# Patient Record
Sex: Female | Born: 1960 | Race: Black or African American | Hispanic: No | Smoking: Never smoker
Health system: Southern US, Community
[De-identification: ages and names within clinical notes are randomized; demographics above are authoritative.]

## PROBLEM LIST (undated history)

## (undated) DIAGNOSIS — A599 Trichomoniasis, unspecified: Secondary | ICD-10-CM

## (undated) DIAGNOSIS — B009 Herpesviral infection, unspecified: Secondary | ICD-10-CM

## (undated) DIAGNOSIS — N904 Leukoplakia of vulva: Secondary | ICD-10-CM

## (undated) DIAGNOSIS — K219 Gastro-esophageal reflux disease without esophagitis: Secondary | ICD-10-CM

## (undated) DIAGNOSIS — B9689 Other specified bacterial agents as the cause of diseases classified elsewhere: Secondary | ICD-10-CM

## (undated) DIAGNOSIS — N62 Hypertrophy of breast: Secondary | ICD-10-CM

## (undated) DIAGNOSIS — N760 Acute vaginitis: Secondary | ICD-10-CM

## (undated) DIAGNOSIS — D219 Benign neoplasm of connective and other soft tissue, unspecified: Secondary | ICD-10-CM

## (undated) DIAGNOSIS — D649 Anemia, unspecified: Secondary | ICD-10-CM

## (undated) DIAGNOSIS — I10 Essential (primary) hypertension: Secondary | ICD-10-CM

## (undated) DIAGNOSIS — D071 Carcinoma in situ of vulva: Secondary | ICD-10-CM

## (undated) HISTORY — DX: Trichomoniasis, unspecified: A59.9

## (undated) HISTORY — DX: Other specified bacterial agents as the cause of diseases classified elsewhere: N76.0

## (undated) HISTORY — PX: DILATION AND CURETTAGE OF UTERUS: SHX78

## (undated) HISTORY — PX: HYSTEROSCOPY: SHX211

## (undated) HISTORY — DX: Herpesviral infection, unspecified: B00.9

## (undated) HISTORY — DX: Other specified bacterial agents as the cause of diseases classified elsewhere: B96.89

## (undated) HISTORY — DX: Hypertrophy of breast: N62

## (undated) HISTORY — DX: Gastro-esophageal reflux disease without esophagitis: K21.9

## (undated) HISTORY — DX: Carcinoma in situ of vulva: D07.1

## (undated) HISTORY — DX: Benign neoplasm of connective and other soft tissue, unspecified: D21.9

## (undated) HISTORY — DX: Leukoplakia of vulva: N90.4

## (undated) HISTORY — DX: Anemia, unspecified: D64.9

## (undated) HISTORY — DX: Essential (primary) hypertension: I10

---

## 1998-12-22 ENCOUNTER — Other Ambulatory Visit: Admission: RE | Admit: 1998-12-22 | Discharge: 1998-12-22 | Payer: Self-pay | Admitting: Obstetrics and Gynecology

## 1999-12-25 ENCOUNTER — Other Ambulatory Visit: Admission: RE | Admit: 1999-12-25 | Discharge: 1999-12-25 | Payer: Self-pay | Admitting: Obstetrics and Gynecology

## 1999-12-28 ENCOUNTER — Ambulatory Visit (HOSPITAL_COMMUNITY): Admission: RE | Admit: 1999-12-28 | Discharge: 1999-12-28 | Payer: Self-pay | Admitting: Obstetrics and Gynecology

## 1999-12-28 ENCOUNTER — Encounter: Payer: Self-pay | Admitting: Obstetrics and Gynecology

## 2001-02-04 ENCOUNTER — Other Ambulatory Visit: Admission: RE | Admit: 2001-02-04 | Discharge: 2001-02-04 | Payer: Self-pay | Admitting: Obstetrics and Gynecology

## 2002-02-17 ENCOUNTER — Other Ambulatory Visit: Admission: RE | Admit: 2002-02-17 | Discharge: 2002-02-17 | Payer: Self-pay | Admitting: Obstetrics and Gynecology

## 2003-02-22 ENCOUNTER — Other Ambulatory Visit: Admission: RE | Admit: 2003-02-22 | Discharge: 2003-02-22 | Payer: Self-pay | Admitting: Obstetrics and Gynecology

## 2003-03-02 ENCOUNTER — Encounter: Payer: Self-pay | Admitting: Obstetrics and Gynecology

## 2003-03-02 ENCOUNTER — Ambulatory Visit (HOSPITAL_COMMUNITY): Admission: RE | Admit: 2003-03-02 | Discharge: 2003-03-02 | Payer: Self-pay | Admitting: Obstetrics and Gynecology

## 2003-03-17 ENCOUNTER — Ambulatory Visit (HOSPITAL_COMMUNITY): Admission: RE | Admit: 2003-03-17 | Discharge: 2003-03-17 | Payer: Self-pay | Admitting: Obstetrics and Gynecology

## 2003-03-17 ENCOUNTER — Encounter: Payer: Self-pay | Admitting: Obstetrics and Gynecology

## 2003-08-25 ENCOUNTER — Ambulatory Visit (HOSPITAL_COMMUNITY): Admission: RE | Admit: 2003-08-25 | Discharge: 2003-08-25 | Payer: Self-pay | Admitting: Obstetrics and Gynecology

## 2003-08-25 ENCOUNTER — Encounter (INDEPENDENT_AMBULATORY_CARE_PROVIDER_SITE_OTHER): Payer: Self-pay | Admitting: *Deleted

## 2004-02-29 ENCOUNTER — Other Ambulatory Visit: Admission: RE | Admit: 2004-02-29 | Discharge: 2004-02-29 | Payer: Self-pay | Admitting: Obstetrics and Gynecology

## 2004-03-15 ENCOUNTER — Ambulatory Visit (HOSPITAL_COMMUNITY): Admission: RE | Admit: 2004-03-15 | Discharge: 2004-03-15 | Payer: Self-pay | Admitting: Obstetrics and Gynecology

## 2005-05-20 ENCOUNTER — Other Ambulatory Visit: Admission: RE | Admit: 2005-05-20 | Discharge: 2005-05-20 | Payer: Self-pay | Admitting: Obstetrics and Gynecology

## 2005-08-29 ENCOUNTER — Ambulatory Visit (HOSPITAL_COMMUNITY): Admission: RE | Admit: 2005-08-29 | Discharge: 2005-08-29 | Payer: Self-pay | Admitting: Obstetrics and Gynecology

## 2006-06-10 ENCOUNTER — Other Ambulatory Visit: Admission: RE | Admit: 2006-06-10 | Discharge: 2006-06-10 | Payer: Self-pay | Admitting: Obstetrics and Gynecology

## 2006-09-01 ENCOUNTER — Ambulatory Visit (HOSPITAL_COMMUNITY): Admission: RE | Admit: 2006-09-01 | Discharge: 2006-09-01 | Payer: Self-pay | Admitting: Obstetrics and Gynecology

## 2007-09-16 ENCOUNTER — Ambulatory Visit (HOSPITAL_COMMUNITY): Admission: RE | Admit: 2007-09-16 | Discharge: 2007-09-16 | Payer: Self-pay | Admitting: Obstetrics and Gynecology

## 2007-09-23 ENCOUNTER — Encounter: Admission: RE | Admit: 2007-09-23 | Discharge: 2007-09-23 | Payer: Self-pay | Admitting: Obstetrics and Gynecology

## 2008-09-23 ENCOUNTER — Ambulatory Visit (HOSPITAL_COMMUNITY): Admission: RE | Admit: 2008-09-23 | Discharge: 2008-09-23 | Payer: Self-pay | Admitting: Obstetrics and Gynecology

## 2009-09-26 ENCOUNTER — Ambulatory Visit (HOSPITAL_COMMUNITY): Admission: RE | Admit: 2009-09-26 | Discharge: 2009-09-26 | Payer: Self-pay | Admitting: Obstetrics and Gynecology

## 2010-11-01 ENCOUNTER — Ambulatory Visit (HOSPITAL_COMMUNITY): Admission: RE | Admit: 2010-11-01 | Discharge: 2010-11-01 | Payer: Self-pay | Admitting: Obstetrics and Gynecology

## 2010-12-23 ENCOUNTER — Encounter: Payer: Self-pay | Admitting: Obstetrics and Gynecology

## 2011-04-19 NOTE — H&P (Signed)
NAME:  Erika Ortiz, Erika Ortiz                          ACCOUNT NO.:  [id number]   MEDICAL RECORD NO.:  [id number]                   PATIENT TYPE:  AMB   LOCATION:  SDC                                  FACILITY:  [facility]   PHYSICIAN:  Erika Ortiz, M.D.             DATE OF BIRTH:  1961-02-18   DATE OF ADMISSION:  DATE OF DISCHARGE:                                HISTORY & PHYSICAL   HISTORY OF PRESENT ILLNESS:  Erika Ortiz is a 50 year old divorced African-  American female para 1-0-3-1 who presents for hysteroscopy because of  intermenstrual bleeding.  For the past four months the patient has bled  approximately three times per month intermittently in addition to her normal  menstrual flow.  This intermenstrual bleeding lasts approximately two days  with episode, causing her to change a tampon twice daily but she reports no  cramping.  With use of oral contraceptives, the frequency of the patient's  intermenstrual bleeding has decreased; however, has not totally resolved.  A  TSH in March 2004 was within normal limits, hemoglobin 11.2, and a  sonohysterogram in April 2004 revealed a uterus measuring 10.4 cm x 4.1 cm,  normal-appearing ovaries, and no evidence of lesions within the endometrial  canal.  The patient denies any nausea, vomiting, diarrhea, urinary tract  symptoms, dyspareunia, or cramping associated with her symptoms.  Options  for management and evaluation were discussed with the patient to include  observation, hysteroscopy, and endometrial ablation.  Since the patient  desires to preserve her fertility she has chosen hysteroscopy for further  evaluation of her abnormal bleeding.   PAST MEDICAL HISTORY:  1. OB history:  Gravida 4 para 1-0-3-1.  The patient has had cesarean     section.  2. GYN history:  Menarche 50 years old.  The patient's menstrual flow has     been regular until as mentioned in history of present illness.  She uses     oral contraceptives as a method of  contraception.  She has a remote     history of sexually-transmitted diseases.  Denies any history of abnormal     Pap smears.  Her last normal Pap smear and mammogram was in March 2004.  3. Medical history:  GERD, anemia, and herpes simplex virus 2.  4. Surgical history:  Cesarean section.  The patient denies any problems     with anesthesia or history of blood transfusions.   FAMILY HISTORY:  Positive for lung cancer and heart disease.   SOCIAL HISTORY:  The patient is single and she works as an Geologist, engineering.   HABITS:  The patient does not use tobacco and she drinks alcohol on  occasion.   CURRENT MEDICATIONS:  Ortho Tri-Cyclen Lo.   ALLERGIES:  No known drug allergies.   REVIEW OF SYSTEMS:  Positive for occasional lower back pain; otherwise,  negative except as mentioned in history of present  illness.   PHYSICAL EXAMINATION:  VITAL SIGNS:  Blood pressure is 105/80, weight is  182, height is 5 feet 1 inch tall.  NECK:  Supple without masses.  There is no adenopathy or thyromegaly.  HEART:  Regular rate and rhythm.  There is no murmur.  LUNGS:  Clear to auscultation.  There are no wheezes, rales, or rhonchi.  BACK:  No CVA tenderness.  ABDOMEN:  Bowel sounds are present.  It is soft.  It is diffusely tender  without guarding, rebound, or organomegaly.  EXTREMITIES:  Without clubbing, cyanosis, or edema.  PELVIC:  EG/BUS is within normal limits.  Vagina is rugous.  Cervix is  nontender without lesions.  Uterus appears 10-12 weeks size, tender, mobile.  Adnexa without tenderness or masses.  Rectovaginal exam is without  tenderness or masses.   IMPRESSION:  Intermenstrual bleeding.   DISPOSITION:  The patient has chosen hysteroscopy with the possibility of  resection of any lesion found within the endometrium as a means of  evaluation and/or management of her symptoms.  She understands the  implications for this procedure along with its risks which include but are  not limited  to:  Reaction to anesthesia, damage to adjacent organs,  excessive bleeding, and infection.  The patient is scheduled to undergo  hysteroscopy with possible D&C on August 25, 2003 at [facility] at 9 a.m.     [place] J. Adline Peals.                    Erika Ortiz, M.D.    EJP/MEDQ  D:  08/19/2003  T:  08/19/2003  Job:  [id number]

## 2011-04-19 NOTE — Op Note (Signed)
   NAME:  Erika Ortiz, Erika Ortiz                          ACCOUNT NO.:  [id number]   MEDICAL RECORD NO.:  [id number]                   PATIENT TYPE:  AMB   LOCATION:  SDC                                  [facility]ACILITY:  [facility]   PHYSICIAN:  Hal Morales, M.D.             DATE OF BIRTH:  Sep 08, 1961   DATE OF PROCEDURE:  08/25/2003  DATE OF DISCHARGE:                                 OPERATIVE REPORT   PREOPERATIVE DIAGNOSIS:  Intermenstrual bleeding.   POSTOPERATIVE DIAGNOSIS:  Intermenstrual bleeding.   OPERATION:  Diagnostic hysteroscopy, diagnostic dilatation and curettage.   SURGEON:  Hal Morales, M.D.   ANESTHESIA:  General LMA.   ESTIMATED BLOOD LOSS:  Less than 10 mL.   COMPLICATIONS:  None.   FINDINGS:  The uterus was upper limits of normal size and sounded to 9 cm.  At the time of hysteroscopy no intrauterine endometrial lesions could be  noted.  A small amount of tissue was obtained at the time of curettage.  The  fluid deficit was 110 mL.   DESCRIPTION OF PROCEDURE:  The patient was taken to the operating room after  appropriate identification and placed on the operating table.  After the  attainment of adequate general anesthesia, she was placed in the modified  lithotomy position.  The perineum and vagina were prepped with multiple  layers of Betadine and draped into a sterile field.  A red Robinson catheter  was used to empty the bladder.  A Graves speculum was placed in the vagina  and a paracervical block achieved with a total of 10 mL of 2% Xylocaine in  the 5 and 7 o'clock positions.  The uterus was sounded to 9 cm.  The cervix  was dilated to accommodate the diagnostic hysteroscope and the hysteroscope  was used to investigate all quadrants of the endometrial cavity with the  findings of no lesions.  The hysteroscope was removed and a medium curette  used to curette all four quadrants of the uterus with a small amount of  curettings obtained.  The hysteroscope was  again used to further document no  noted intrauterine lesions.  All instruments were then removed from the  operative site and the patient awakened from general anesthesia and taken to  the recovery room in satisfactory condition, having tolerated the procedure  well with sponge and instrument counts correct.   SPECIMENS TO PATHOLOGY:  Endometrial curettings.                                                Hal Morales, M.D.    VPH/MEDQ  D:  08/25/2003  T:  08/26/2003  Job:  [id number]

## 2011-11-13 ENCOUNTER — Other Ambulatory Visit (HOSPITAL_COMMUNITY): Payer: Self-pay | Admitting: Obstetrics and Gynecology

## 2011-11-13 DIAGNOSIS — Z1231 Encounter for screening mammogram for malignant neoplasm of breast: Secondary | ICD-10-CM

## 2011-12-18 ENCOUNTER — Ambulatory Visit (HOSPITAL_COMMUNITY)
Admission: RE | Admit: 2011-12-18 | Discharge: 2011-12-18 | Disposition: A | Discharge status: Home / Self Care | Payer: 59 | Source: Ambulatory Visit | Attending: Obstetrics and Gynecology | Admitting: Obstetrics and Gynecology

## 2011-12-18 DIAGNOSIS — Z1231 Encounter for screening mammogram for malignant neoplasm of breast: Secondary | ICD-10-CM | POA: Insufficient documentation

## 2012-09-01 ENCOUNTER — Ambulatory Visit (INDEPENDENT_AMBULATORY_CARE_PROVIDER_SITE_OTHER): Payer: 59 | Admitting: Obstetrics and Gynecology

## 2012-09-01 ENCOUNTER — Encounter: Payer: Self-pay | Admitting: Obstetrics and Gynecology

## 2012-09-01 VITALS — BP 112/66 | Ht 61.0 in | Wt 221.0 lb

## 2012-09-01 DIAGNOSIS — I10 Essential (primary) hypertension: Secondary | ICD-10-CM

## 2012-09-01 DIAGNOSIS — R350 Frequency of micturition: Secondary | ICD-10-CM

## 2012-09-01 DIAGNOSIS — B9689 Other specified bacterial agents as the cause of diseases classified elsewhere: Secondary | ICD-10-CM

## 2012-09-01 DIAGNOSIS — Z124 Encounter for screening for malignant neoplasm of cervix: Secondary | ICD-10-CM

## 2012-09-01 DIAGNOSIS — N898 Other specified noninflammatory disorders of vagina: Secondary | ICD-10-CM

## 2012-09-01 DIAGNOSIS — N760 Acute vaginitis: Secondary | ICD-10-CM

## 2012-09-01 DIAGNOSIS — Z309 Encounter for contraceptive management, unspecified: Secondary | ICD-10-CM

## 2012-09-01 DIAGNOSIS — N920 Excessive and frequent menstruation with regular cycle: Secondary | ICD-10-CM

## 2012-09-01 DIAGNOSIS — A499 Bacterial infection, unspecified: Secondary | ICD-10-CM

## 2012-09-01 LAB — POCT WET PREP (WET MOUNT)
Clue Cells Wet Prep Whiff POC: NEGATIVE
KOH Wet Prep POC: NEGATIVE
Trichomonas Wet Prep HPF POC: NEGATIVE
WBC, Wet Prep HPF POC: NEGATIVE

## 2012-09-01 LAB — POCT URINALYSIS DIPSTICK
Bilirubin, UA: NEGATIVE
Nitrite, UA: NEGATIVE
Urobilinogen, UA: NEGATIVE

## 2012-09-01 MED ORDER — METRONIDAZOLE 500 MG PO TABS: 500.0000 mg | ORAL_TABLET | Freq: Two times a day (BID) | ORAL | Status: AC

## 2012-09-01 MED ORDER — MISOPROSTOL 200 MCG PO TABS: ORAL_TABLET | ORAL | Status: DC

## 2012-09-01 MED ORDER — METRONIDAZOLE 500 MG PO TABS: 500.0000 mg | ORAL_TABLET | Freq: Three times a day (TID) | ORAL | Status: DC

## 2012-09-01 MED ORDER — NORETHINDRONE 0.35 MG PO TABS: 1.0000 | ORAL_TABLET | Freq: Every day | ORAL | Status: AC

## 2012-09-01 NOTE — Progress Notes (Signed)
AEX  Last Pap: 08/30/2009 WNL: No Regular Periods:yes Contraception: Nortrel 7,7,7  Monthly Breast exam:yes Tetanus<81yrs:yes Nl.Bladder Function:no increased urination within last week Daily BMs:yes Healthy Diet:no Calcium:no Mammogram:yes Date of Mammogram: 12/18/2011 Exercise:no Have often Exercise: n/a Seatbelt: yes Abuse at home: no Stressful work:no Sigmoid-colonoscopy: n/a Bone Density: No PCP: Johnn Hai Change in PMH: None Change in [facility]: None   Subjective:    Erika Ortiz is a 51 y.o. female, G4P1, who presents for an annual exam. She c/o brownish d/c without irritation and a slight odor.  Not sexually active.  BCPs regulate and minimize menses    History   Social History  . Marital Status: Divorced    Spouse Name: N/A    Number of Children: N/A  . Years of Education: N/A   Social History Main Topics  . Smoking status: Never Smoker   . Smokeless tobacco: Never Used  . Alcohol Use: No  . Drug Use: No  . Sexually Active: Yes    Birth Control/ Protection: Pill     NORTREL   Other Topics Concern  . None   Social History Narrative  . None    Menstrual cycle:   LMP: Patient's last menstrual period was 08/19/2012.           Cycle: reg with BCPs.  No IM bleeding.  No cramps.  The following portions of the patient's history were reviewed and updated as appropriate: allergies, current medications, past family history, past medical history, past social history, past surgical history and problem list.  Review of Systems Pertinent items are noted in HPI. Breast:Negative for breast lump,nipple discharge or nipple retraction Gastrointestinal: Negative for abdominal pain, change in bowel habits or rectal bleeding Urinary:negative   Objective:    BP 112/66  Ht 5\' 1"  (1.549 m)  Wt 221 lb (100.245 kg)  BMI 41.76 kg/m2  LMP 08/19/2012    Weight:  Wt Readings from Last 1 Encounters:  09/01/12 221 lb (100.245 kg)          BMI: Body mass index is  41.76 kg/(m^2).  General Appearance: Alert, appropriate appearance for age. No acute distress HEENT: Grossly normal Neck / Thyroid: Supple, no masses, nodes or enlargement Lungs: clear to auscultation bilaterally Back: No CVA tenderness Breast Exam: No masses or nodes.No dimpling, nipple retraction or discharge. Cardiovascular: Regular rate and rhythm. S1, S2, no murmur Gastrointestinal: Soft, non-tender, no masses or organomegaly Pelvic Exam: Vulva and vagina appear normal. Bimanual exam reveals normal uterus and adnexa. Exam limited by body habitus Rectovaginal: normal rectal, no masses Lymphatic Exam: Non-palpable nodes in neck, clavicular, axillary, or inguinal regions Skin: no rash or abnormalities Neurologic: Normal gait and speech, no tremor  Psychiatric: Alert and oriented, appropriate affect.   Wet Prep:positive clue cells, pH 5.0, OSOM BV:  POS OSOM TRICH:  NEG     Assessment:    BV Menses well regulated on BCPs Chronic hypertnsion   Plan:   Metronidazole  OTC Luvenia after menses mammogram pap smear return 3 months STD screening: declined Contraception:oral contraceptives (estrogen/progesterone)peviously.  Oral progesterone-only contraceptive recommended and accepted because of hypertension      Dierdre Forth MD

## 2012-09-01 NOTE — Patient Instructions (Signed)
OTC Luvenia to be used after menstrual period     Bacterial Vaginosis Bacterial vaginosis (BV) is a vaginal infection where the normal balance of bacteria in the vagina is disrupted. The normal balance is then replaced by an overgrowth of certain bacteria. There are several different kinds of bacteria that can cause BV. BV is the most common vaginal infection in women of childbearing age. CAUSES   The cause of BV is not fully understood. BV develops when there is an increase or imbalance of harmful bacteria.  Some activities or behaviors can upset the normal balance of bacteria in the vagina and put women at increased risk including:  Having a new sex partner or multiple sex partners.  Douching.  Using an intrauterine device (IUD) for contraception.  It is not clear what role sexual activity plays in the development of BV. However, women that have never had sexual intercourse are rarely infected with BV. Women do not get BV from toilet seats, bedding, swimming pools or from touching objects around them.  SYMPTOMS   Grey vaginal discharge.  A fish-like odor with discharge, especially after sexual intercourse.  Itching or burning of the vagina and vulva.  Burning or pain with urination.  Some women have no signs or symptoms at all. DIAGNOSIS  Your caregiver must examine the vagina for signs of BV. Your caregiver will perform lab tests and look at the sample of vaginal fluid through a microscope. They will look for bacteria and abnormal cells (clue cells), a pH test higher than 4.5, and a positive amine test all associated with BV.  RISKS AND COMPLICATIONS   Pelvic inflammatory disease (PID).  Infections following gynecology surgery.  Developing HIV.  Developing herpes virus. TREATMENT  Sometimes BV will clear up without treatment. However, all women with symptoms of BV should be treated to avoid complications, especially if gynecology surgery is planned. Female partners  generally do not need to be treated. However, BV may spread between female sex partners so treatment is helpful in preventing a recurrence of BV.   BV may be treated with antibiotics. The antibiotics come in either pill or vaginal cream forms. Either can be used with nonpregnant or pregnant women, but the recommended dosages differ. These antibiotics are not harmful to the baby.  BV can recur after treatment. If this happens, a second round of antibiotics will often be prescribed.  Treatment is important for pregnant women. If not treated, BV can cause a premature delivery, especially for a pregnant woman who had a premature birth in the past. All pregnant women who have symptoms of BV should be checked and treated.  For chronic reoccurrence of BV, treatment with a type of prescribed gel vaginally twice a week is helpful. HOME CARE INSTRUCTIONS   Finish all medication as directed by your caregiver.  Do not have sex until treatment is completed.  Tell your sexual partner that you have a vaginal infection. They should see their caregiver and be treated if they have problems, such as a mild rash or itching.  Practice safe sex. Use condoms. Only have 1 sex partner. PREVENTION  Basic prevention steps can help reduce the risk of upsetting the natural balance of bacteria in the vagina and developing BV:  Do not have sexual intercourse (be abstinent).  Do not douche.  Use all of the medicine prescribed for treatment of BV, even if the signs and symptoms go away.  Tell your sex partner if you have BV. That way, they can  be treated, if needed, to prevent reoccurrence. SEEK MEDICAL CARE IF:   Your symptoms are not improving after 3 days of treatment.  You have increased discharge, pain, or fever. MAKE SURE YOU:   Understand these instructions.  Will watch your condition.  Will get help right away if you are not doing well or get worse. FOR MORE INFORMATION  Division of STD Prevention  (DSTDP), Centers for Disease Control and Prevention: [employer] American Social Health Association (ASHA): www.ashastd.org  Document Released: 11/18/2005 Document Revised: 02/10/2012 Document Reviewed: 05/11/2009 [facility] Patient Information 2013 [place], [place].

## 2012-09-03 ENCOUNTER — Telehealth: Payer: Self-pay | Admitting: Obstetrics and Gynecology

## 2012-09-03 NOTE — Telephone Encounter (Signed)
VM from pt.  States saw DR [facility] 09/01/12. Was to be given RX for Metronidazole # 14.  Pt received different med and quantity. Wants to verify before taking. PT [phone]

## 2012-09-03 NOTE — Telephone Encounter (Signed)
Tc to pt per telephone call. Pt states,"was given Metronidazole 500mg  TID #17 pills by pharm". Rx for Metronidazole 500mg  BID #14 was e-pres to pharm on 09/01/12 by VPH. Pt has not taken any pills at this time. Will consult with pharm and cb with recs. Pt agrees. Pc to pharm on file. Pharm aware of error. Pt to bring rx back to have correct dosage/dispense of med. Pt agrees.

## 2012-09-14 ENCOUNTER — Other Ambulatory Visit: Payer: Self-pay | Admitting: Obstetrics and Gynecology

## 2012-09-15 ENCOUNTER — Telehealth: Payer: Self-pay | Admitting: Obstetrics and Gynecology

## 2012-09-16 ENCOUNTER — Encounter: Payer: 59 | Admitting: Obstetrics and Gynecology

## 2012-11-20 ENCOUNTER — Other Ambulatory Visit: Payer: Self-pay | Admitting: Obstetrics and Gynecology

## 2012-11-20 DIAGNOSIS — Z1231 Encounter for screening mammogram for malignant neoplasm of breast: Secondary | ICD-10-CM

## 2012-12-25 ENCOUNTER — Ambulatory Visit (HOSPITAL_COMMUNITY)
Admission: RE | Admit: 2012-12-25 | Discharge: 2012-12-25 | Disposition: A | Discharge status: Home / Self Care | Payer: 59 | Source: Ambulatory Visit | Attending: Obstetrics and Gynecology | Admitting: Obstetrics and Gynecology

## 2012-12-25 DIAGNOSIS — Z1231 Encounter for screening mammogram for malignant neoplasm of breast: Secondary | ICD-10-CM

## 2012-12-28 ENCOUNTER — Encounter: Payer: Self-pay | Admitting: Obstetrics and Gynecology

## 2012-12-28 DIAGNOSIS — R922 Inconclusive mammogram: Secondary | ICD-10-CM | POA: Insufficient documentation

## 2013-10-07 ENCOUNTER — Other Ambulatory Visit: Payer: Self-pay

## 2014-09-16 ENCOUNTER — Other Ambulatory Visit: Payer: Self-pay

## 2014-10-03 ENCOUNTER — Encounter: Payer: Self-pay | Admitting: Obstetrics and Gynecology

## 2016-01-22 ENCOUNTER — Other Ambulatory Visit: Payer: Self-pay | Admitting: Obstetrics and Gynecology

## 2017-05-25 ENCOUNTER — Encounter (HOSPITAL_COMMUNITY): Payer: Self-pay

## 2017-05-25 ENCOUNTER — Emergency Department (HOSPITAL_COMMUNITY)
Admission: EM | Admit: 2017-05-25 | Discharge: 2017-05-25 | Disposition: A | Discharge status: Home / Self Care | Payer: Self-pay | Attending: Emergency Medicine | Admitting: Emergency Medicine

## 2017-05-25 DIAGNOSIS — Z885 Allergy status to narcotic agent status: Secondary | ICD-10-CM | POA: Insufficient documentation

## 2017-05-25 DIAGNOSIS — I10 Essential (primary) hypertension: Secondary | ICD-10-CM | POA: Insufficient documentation

## 2017-05-25 DIAGNOSIS — R51 Headache: Secondary | ICD-10-CM | POA: Insufficient documentation

## 2017-05-25 DIAGNOSIS — Z880 Allergy status to penicillin: Secondary | ICD-10-CM | POA: Insufficient documentation

## 2017-05-25 DIAGNOSIS — R42 Dizziness and giddiness: Secondary | ICD-10-CM | POA: Insufficient documentation

## 2017-05-25 LAB — CBC
HEMATOCRIT: 38.5 % (ref 36.0–46.0)
Hemoglobin: 11.9 g/dL — ABNORMAL LOW (ref 12.0–15.0)
MCH: 25.4 pg — ABNORMAL LOW (ref 26.0–34.0)
MCHC: 30.9 g/dL (ref 30.0–36.0)
MCV: 82.3 fL (ref 78.0–100.0)
PLATELETS: 268 10*3/uL (ref 150–400)
RBC: 4.68 MIL/uL (ref 3.87–5.11)
RDW: 14.1 % (ref 11.5–15.5)
WBC: 7.4 10*3/uL (ref 4.0–10.5)

## 2017-05-25 LAB — BASIC METABOLIC PANEL
ANION GAP: 6 (ref 5–15)
BUN: 9 mg/dL (ref 6–20)
CALCIUM: 9.5 mg/dL (ref 8.9–10.3)
CO2: 25 mmol/L (ref 22–32)
CREATININE: 0.77 mg/dL (ref 0.44–1.00)
Chloride: 107 mmol/L (ref 101–111)
GLUCOSE: 93 mg/dL (ref 65–99)
Potassium: 3.9 mmol/L (ref 3.5–5.1)
Sodium: 138 mmol/L (ref 135–145)

## 2017-05-25 MED ORDER — HYDROCHLOROTHIAZIDE 25 MG PO TABS: 25.0000 mg | ORAL_TABLET | Freq: Every day | ORAL | 0 refills | Status: AC

## 2017-05-25 NOTE — ED Provider Notes (Signed)
[facility] DEPT Provider Note   CSN: [id number] Arrival date & time: 05/25/17  1523     History   Chief Complaint Chief Complaint  Patient presents with  . Hypertension    HPI Erika Ortiz is a 56 y.o. female.  Patient is a 56 year old female with a history of hypertension who presents with elevated blood pressures. She states that she's been off of her blood pressure medicine for a little bit over a year due to financial reasons. She states that she's felt like her blood pressures been higher over the last few days. She's had some intermittent headaches and a little bit of dizziness. She currently denies any symptoms. She denies any headache or dizziness. No chest pain or shortness of breath. No change in her speech. No numbness or weakness to her extremities. She was seen at an equal walk-in clinic and sent over here for further evaluation due to her elevated blood pressure.      Past Medical History:  Diagnosis Date  . Anemia   . BV (bacterial vaginosis)   . Fibroids   . GERD (gastroesophageal reflux disease)   . HSV-2 infection   . Hypertension   . Macromastia   . Trichomonas   . VIN III (vulvar intraepithelial neoplasia III)   . Vulvar dystrophy     Patient Active Problem List   Diagnosis Date Noted  . Dense breasts 12/28/2012  . Hypertension 09/01/2012  . Menorrhagia 09/01/2012    Past Surgical History:  Procedure Laterality Date  . DILATION AND CURETTAGE OF UTERUS    . HYSTEROSCOPY      OB History    Gravida Para Term Preterm AB Living   4 1       1    SAB TAB Ectopic Multiple Live Births                   Home Medications    Prior to Admission medications   Medication Sig Start Date End Date Taking? Authorizing Provider  Cholecalciferol (VITAMIN D PO) Take by mouth.    [provider]  fexofenadine-pseudoephedrine (ALLEGRA-D 24) 180-240 MG per 24 hr tablet Take 1 tablet by mouth daily.    [provider]    hydrochlorothiazide (HYDRODIURIL) 25 MG tablet Take 1 tablet (25 mg total) by mouth daily. 05/25/17   Malvin Johns, MD  norethindrone (MICRONOR,CAMILA,ERRIN) 0.35 MG tablet Take 1 tablet (0.35 mg total) by mouth daily. 09/01/12   Haygood, Seymour Bars, MD    Family History Family History  Problem Relation Age of Onset  . Hypertension Mother   . Hypertension Father   . Cancer Maternal Aunt     Social History Social History  Substance Use Topics  . Smoking status: Never Smoker  . Smokeless tobacco: Never Used  . Alcohol use No     Allergies   Amoxicillin; Hydrocodone; Naproxen; and Penicillins   Review of Systems Review of Systems  Constitutional: Negative for chills, diaphoresis, fatigue and fever.  HENT: Negative for congestion, rhinorrhea and sneezing.   Eyes: Negative.   Respiratory: Negative for cough, chest tightness and shortness of breath.   Cardiovascular: Negative for chest pain and leg swelling.  Gastrointestinal: Negative for abdominal pain, blood in stool, diarrhea, nausea and vomiting.  Genitourinary: Negative for difficulty urinating, flank pain, frequency and hematuria.  Musculoskeletal: Negative for arthralgias and back pain.  Skin: Negative for rash.  Neurological: Positive for light-headedness and headaches. Negative for dizziness, speech difficulty, weakness and numbness.  Physical Exam Updated Vital Signs BP (!) 199/100 (BP Location: Left Arm)   Pulse 65   Temp 98.2 F (36.8 C) (Oral)   Resp 18   LMP 12/11/2012   SpO2 99%   Physical Exam  Constitutional: She is oriented to person, place, and time. She appears well-developed and well-nourished.  HENT:  Head: Normocephalic and atraumatic.  Eyes: Pupils are equal, round, and reactive to light.  Neck: Normal range of motion. Neck supple.  Cardiovascular: Normal rate, regular rhythm and normal heart sounds.   Pulmonary/Chest: Effort normal and breath sounds normal. No respiratory distress. She  has no wheezes. She has no rales. She exhibits no tenderness.  Abdominal: Soft. Bowel sounds are normal. There is no tenderness. There is no rebound and no guarding.  Musculoskeletal: Normal range of motion. She exhibits no edema.  Lymphadenopathy:    She has no cervical adenopathy.  Neurological: She is alert and oriented to person, place, and time.  Motor 5/5 all extremities Sensation grossly intact to LT all extremities Finger to Nose intact, no pronator drift CN II-XII grossly intact Gait normal   Skin: Skin is warm and dry. No rash noted.  Psychiatric: She has a normal mood and affect.     ED Treatments / Results  Labs (all labs ordered are listed, but only abnormal results are displayed) Labs Reviewed  CBC - Abnormal; Notable for the following:       Result Value   Hemoglobin 11.9 (*)    MCH 25.4 (*)    All other components within normal limits  BASIC METABOLIC PANEL    EKG  EKG Interpretation  Date/Time:  Sunday May 25 2017 17:01:27 EDT Ventricular Rate:  55 PR Interval:    QRS Duration: 88 QT Interval:  419 QTC Calculation: 401 R Axis:   62 Text Interpretation:  Sinus rhythm Atrial premature complex No old tracing to compare Confirmed by Malvin Johns [phone]) on 05/25/2017 5:09:39 PM       Radiology No results found.  Procedures Procedures (including critical care time)  Medications Ordered in ED Medications - No data to display   Initial Impression / Assessment and Plan / ED Course  I have reviewed the triage vital signs and the nursing notes.  Pertinent labs & imaging results that were available during my care of the patient were reviewed by me and considered in my medical decision making (see chart for details).     Patient presents with an elevated blood pressure. Currently she is asymptomatic. She has no ataxia. No neurologic deficits. No chest pain or other suggestions of acute coronary syndrome. No evidence of endorgan damage. She was  discharged home in good condition. She was given a prescription for hydrochlorothiazide and advised to start taking it today. She was advised to follow-up with her PCP who is with [facility] physicians within the next week. Return precautions were given.  Final Clinical Impressions(s) / ED Diagnoses   Final diagnoses:  Essential hypertension    New Prescriptions New Prescriptions   HYDROCHLOROTHIAZIDE (HYDRODIURIL) 25 MG TABLET    Take 1 tablet (25 mg total) by mouth daily.     Malvin Johns, MD 05/25/17 [phone]

## 2017-05-25 NOTE — ED Triage Notes (Signed)
Patient was sent from [facility] for further work-up of HTN. States that she has taken meds in the past and due to financial reasons hasn't taken any meds x 2 years. Had headache yesterday but today resolved. No CP, no SOB. Alert and oriented, NAD

## 2018-12-30 ENCOUNTER — Other Ambulatory Visit: Payer: Self-pay | Admitting: Family Medicine

## 2018-12-30 ENCOUNTER — Other Ambulatory Visit (HOSPITAL_COMMUNITY)
Admission: RE | Admit: 2018-12-30 | Discharge: 2018-12-30 | Disposition: A | Discharge status: Home / Self Care | Payer: Self-pay | Source: Ambulatory Visit | Attending: Family Medicine | Admitting: Family Medicine

## 2018-12-30 DIAGNOSIS — Z01411 Encounter for gynecological examination (general) (routine) with abnormal findings: Secondary | ICD-10-CM | POA: Insufficient documentation

## 2019-01-04 LAB — CYTOLOGY - PAP
Adequacy: ABSENT
Diagnosis: NEGATIVE
HPV: NOT DETECTED

## 2020-12-19 ENCOUNTER — Other Ambulatory Visit: Payer: Self-pay

## 2020-12-20 ENCOUNTER — Other Ambulatory Visit: Payer: Self-pay

## 2020-12-20 DIAGNOSIS — Z20822 Contact with and (suspected) exposure to covid-19: Secondary | ICD-10-CM

## 2020-12-21 LAB — NOVEL CORONAVIRUS, NAA: SARS-CoV-2, NAA: NOT DETECTED

## 2020-12-21 LAB — SARS-COV-2, NAA 2 DAY TAT

## 2021-04-17 ENCOUNTER — Other Ambulatory Visit: Payer: Self-pay | Admitting: Family Medicine

## 2021-04-17 DIAGNOSIS — N950 Postmenopausal bleeding: Secondary | ICD-10-CM

## 2021-05-02 ENCOUNTER — Ambulatory Visit
Admission: RE | Admit: 2021-05-02 | Discharge: 2021-05-02 | Disposition: A | Discharge status: Home / Self Care | Payer: No Typology Code available for payment source | Source: Ambulatory Visit | Attending: Family Medicine | Admitting: Family Medicine

## 2021-05-02 DIAGNOSIS — N950 Postmenopausal bleeding: Secondary | ICD-10-CM

## 2021-06-11 ENCOUNTER — Other Ambulatory Visit: Payer: Self-pay | Admitting: Obstetrics and Gynecology

## 2021-06-11 DIAGNOSIS — Z1231 Encounter for screening mammogram for malignant neoplasm of breast: Secondary | ICD-10-CM

## 2021-09-26 ENCOUNTER — Ambulatory Visit (HOSPITAL_BASED_OUTPATIENT_CLINIC_OR_DEPARTMENT_OTHER): Admission: RE | Admit: 2021-09-26 | Payer: Self-pay | Source: Home / Self Care | Admitting: Obstetrics and Gynecology

## 2021-09-26 ENCOUNTER — Encounter (HOSPITAL_BASED_OUTPATIENT_CLINIC_OR_DEPARTMENT_OTHER): Admission: RE | Payer: Self-pay | Source: Home / Self Care

## 2021-09-26 SURGERY — DILATATION & CURETTAGE/HYSTEROSCOPY WITH MYOSURE
Anesthesia: Choice

## 2021-10-09 ENCOUNTER — Other Ambulatory Visit: Payer: Self-pay | Admitting: Obstetrics and Gynecology

## 2021-11-28 ENCOUNTER — Other Ambulatory Visit (HOSPITAL_COMMUNITY)
Admission: RE | Admit: 2021-11-28 | Discharge: 2021-11-28 | Disposition: A | Discharge status: Home / Self Care | Payer: Self-pay | Source: Ambulatory Visit | Attending: Family Medicine | Admitting: Family Medicine

## 2021-11-28 ENCOUNTER — Other Ambulatory Visit: Payer: Self-pay | Admitting: Family Medicine

## 2021-11-28 DIAGNOSIS — Z124 Encounter for screening for malignant neoplasm of cervix: Secondary | ICD-10-CM | POA: Insufficient documentation

## 2021-11-30 LAB — CYTOLOGY - PAP: Diagnosis: NEGATIVE

## 2021-12-24 IMAGING — US US PELVIS COMPLETE WITH TRANSVAGINAL
1 series · 13 of 25 positions shown · non-contrast
Comparison: None

CLINICAL DATA: Post menopausal bleeding



[Series 1: us pelvis complete with transvaginal · 0.23mm/px · 13 of 48 slices shown]
[im 1/48]
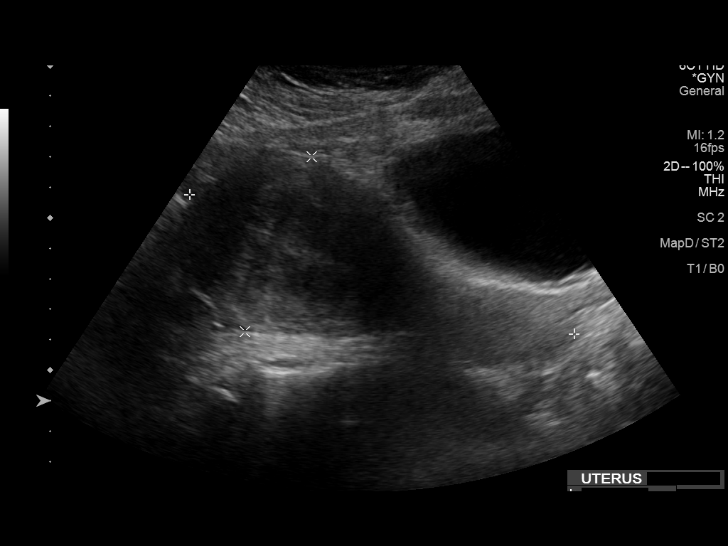
[im 4/48]
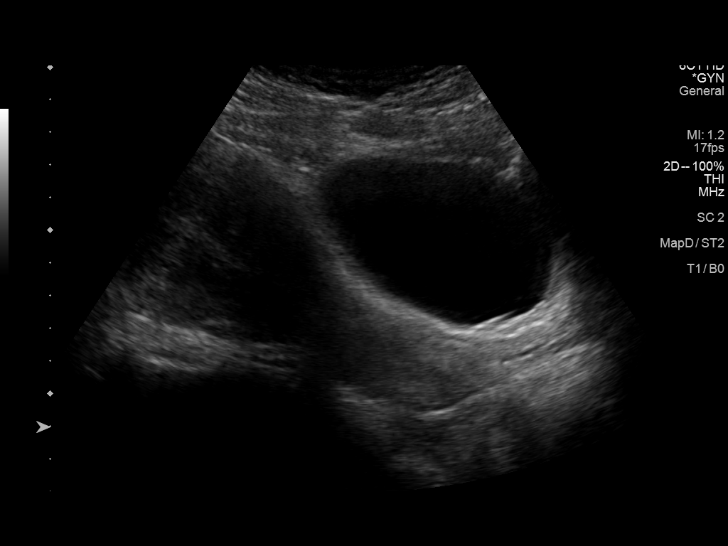
[im 8/48]
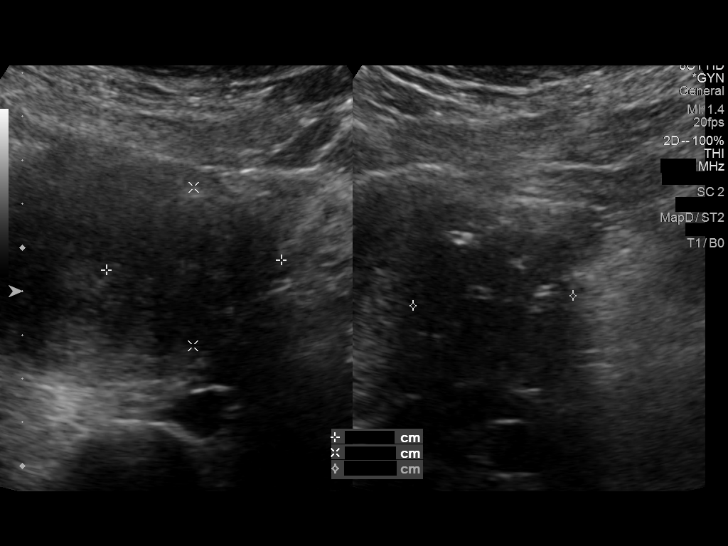
[im 12/48]
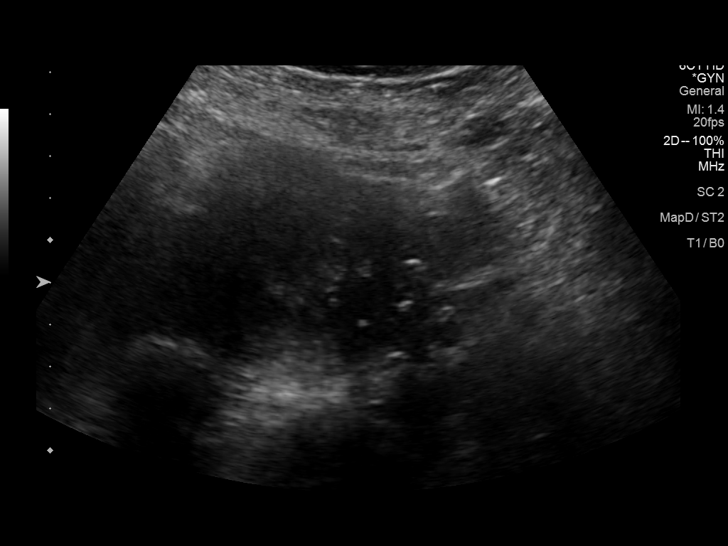
[im 16/48]
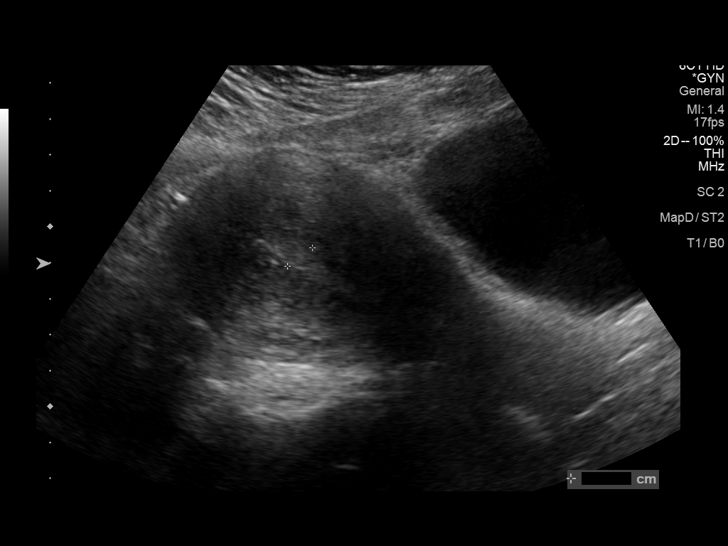
[im 20/48]
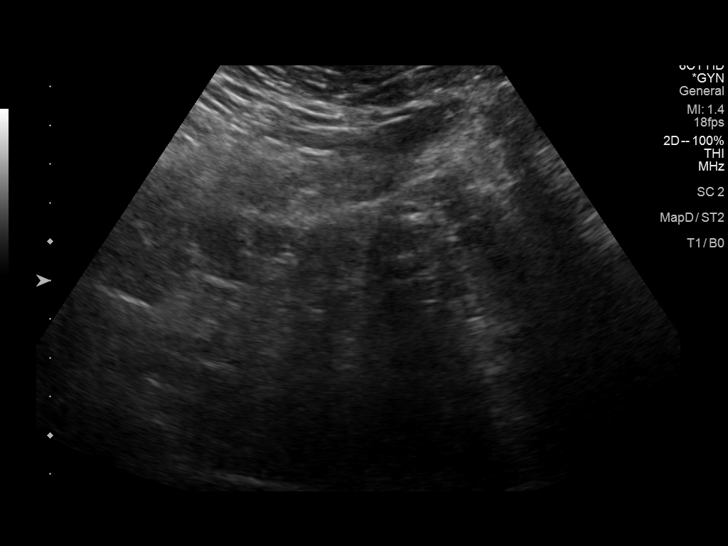
[im 24/48]
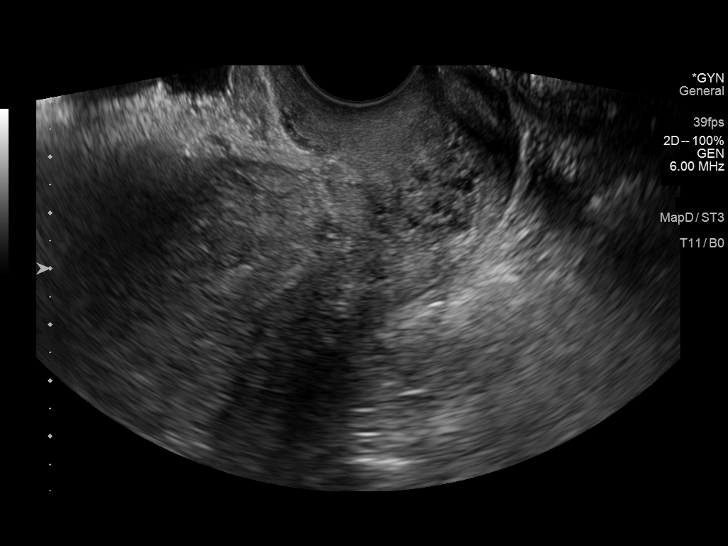
[im 28/48]
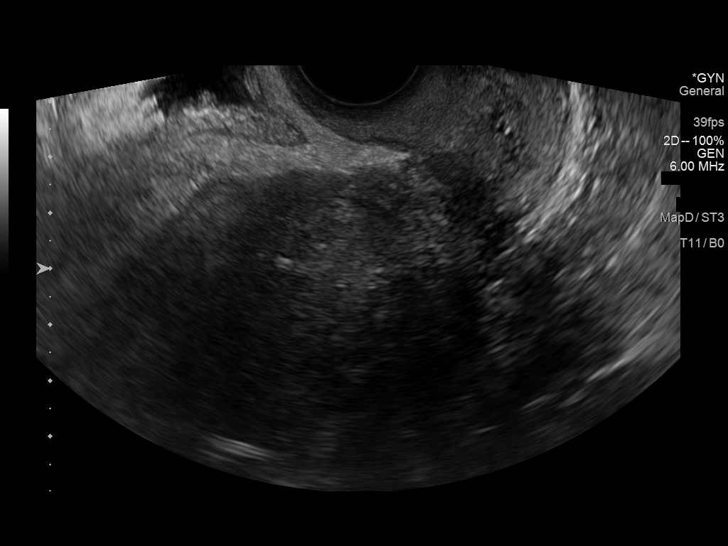
[im 32/48]
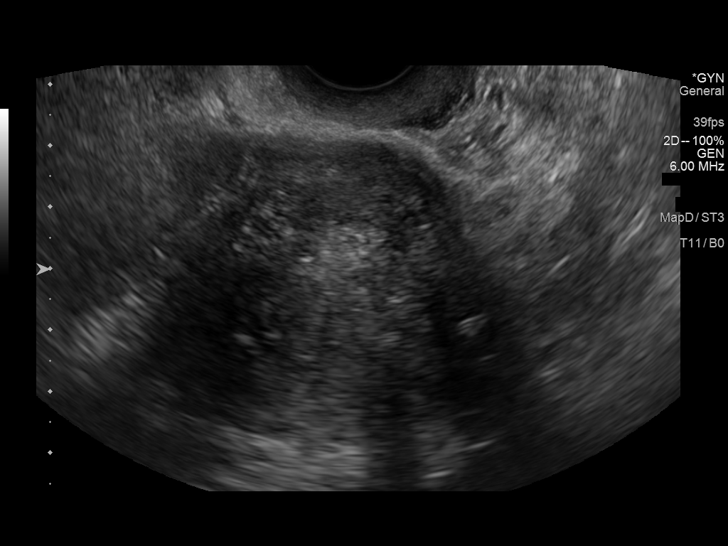
[im 36/48]
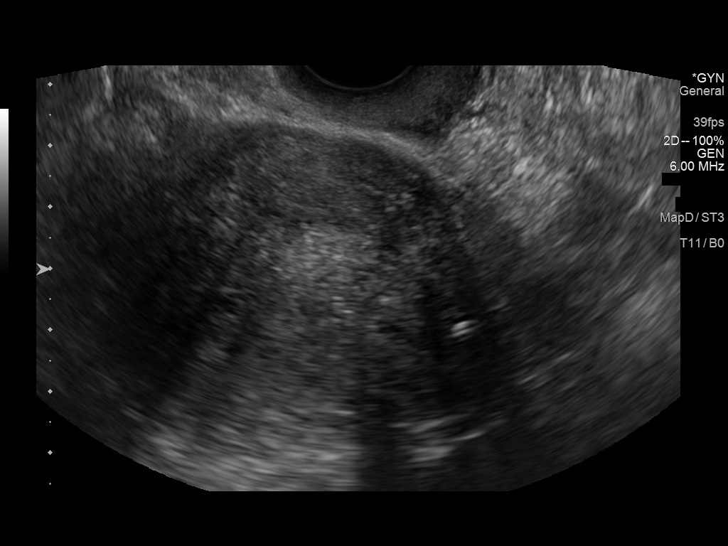
[im 40/48]
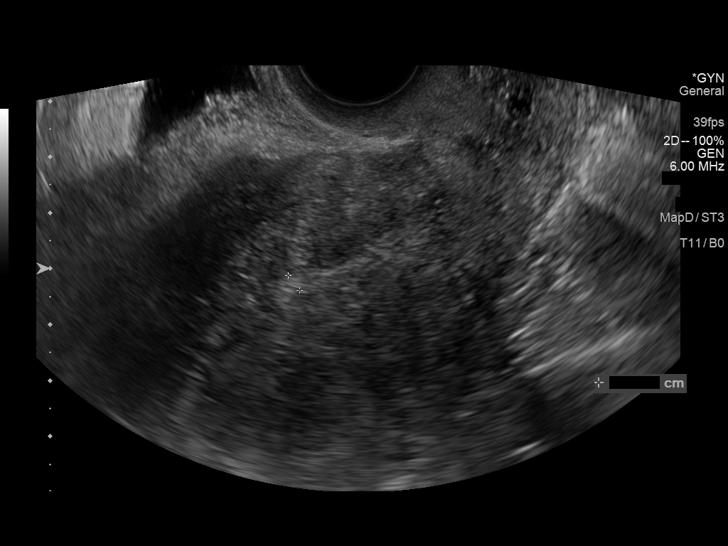
[im 44/48]
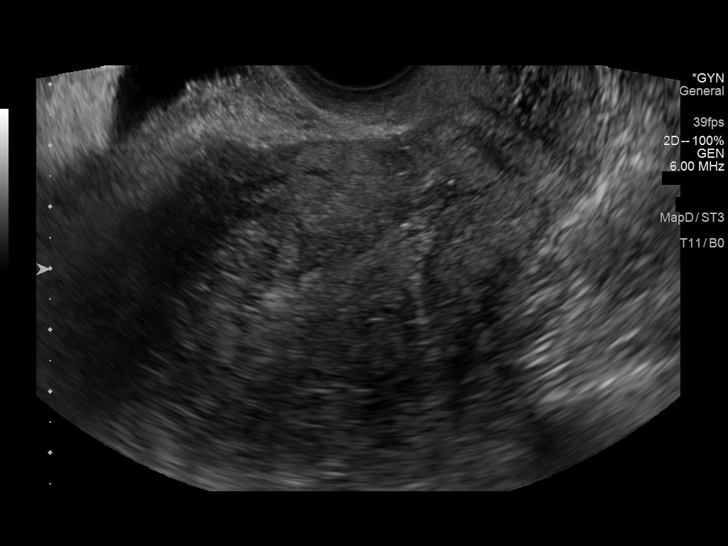
[im 48/48]
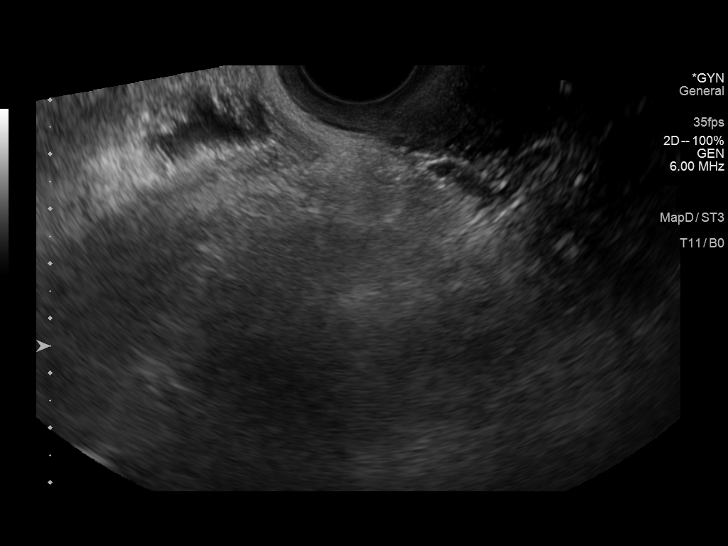

[13 of 25 positions shown; findings below may reference images not displayed]

FINDINGS: Uterus

Measurements: 13.4 x 6.1 x 8.1 cm = volume: 351 mL. Heterogeneous
echotexture with fundal prominence and areas of shadowing. Left
posterior subserosal mass measuring 2.3 x 2.3 x 2.4 cm.

Endometrium

Thickness: 3.4 mm.  No focal abnormality visualized.

Right ovary

Not seen

Left ovary

Not seen

Other findings

No abnormal free fluid.
IMPRESSION: 1. Endometrial thickness of 3.4 mm. In the setting of
post-menopausal bleeding, this is consistent with a benign etiology
such as endometrial atrophy. If bleeding remains unresponsive to
hormonal or medical therapy, sonohysterogram should be considered
for focal lesion work-up. (Ref: Radiological Reasoning: Algorithmic
Workup of Abnormal Vaginal Bleeding with Endovaginal Sonography and
Sonohysterography. AJR [id number]; 191:S68-73)
2. Slightly enlarged uterus with fundal prominence, heterogeneous
echotexture and areas of shadowing, question adenomyosis. 2.4 cm
left uterine fibroid.
3. Nonvisualized ovaries

## 2023-11-03 ENCOUNTER — Emergency Department (HOSPITAL_BASED_OUTPATIENT_CLINIC_OR_DEPARTMENT_OTHER): Payer: MEDICAID | Admitting: Radiology

## 2023-11-03 ENCOUNTER — Emergency Department (HOSPITAL_BASED_OUTPATIENT_CLINIC_OR_DEPARTMENT_OTHER)
Admission: EM | Admit: 2023-11-03 | Discharge: 2023-11-03 | Disposition: A | Discharge status: Home / Self Care | Payer: MEDICAID | Attending: Emergency Medicine | Admitting: Emergency Medicine

## 2023-11-03 ENCOUNTER — Other Ambulatory Visit: Payer: Self-pay

## 2023-11-03 ENCOUNTER — Encounter (HOSPITAL_BASED_OUTPATIENT_CLINIC_OR_DEPARTMENT_OTHER): Payer: Self-pay

## 2023-11-03 DIAGNOSIS — I10 Essential (primary) hypertension: Secondary | ICD-10-CM | POA: Insufficient documentation

## 2023-11-03 DIAGNOSIS — Z79899 Other long term (current) drug therapy: Secondary | ICD-10-CM | POA: Insufficient documentation

## 2023-11-03 DIAGNOSIS — Z1152 Encounter for screening for COVID-19: Secondary | ICD-10-CM | POA: Insufficient documentation

## 2023-11-03 DIAGNOSIS — J069 Acute upper respiratory infection, unspecified: Secondary | ICD-10-CM | POA: Insufficient documentation

## 2023-11-03 LAB — RESP PANEL BY RT-PCR (RSV, FLU A&B, COVID)  RVPGX2
Influenza A by PCR: NEGATIVE
Influenza B by PCR: NEGATIVE
Resp Syncytial Virus by PCR: NEGATIVE
SARS Coronavirus 2 by RT PCR: NEGATIVE

## 2023-11-03 MED ORDER — AZITHROMYCIN 250 MG PO TABS
500.0000 mg | ORAL_TABLET | Freq: Once | ORAL | Status: AC
  Administered 2023-11-03: 500 mg via ORAL
  Filled 2023-11-03: qty 2

## 2023-11-03 MED ORDER — AZITHROMYCIN 250 MG PO TABS: 250.0000 mg | ORAL_TABLET | Freq: Every day | ORAL | 0 refills | Status: AC

## 2023-11-03 MED ORDER — DEXAMETHASONE 4 MG PO TABS
10.0000 mg | ORAL_TABLET | Freq: Once | ORAL | Status: AC
  Administered 2023-11-03: 10 mg via ORAL
  Filled 2023-11-03: qty 3

## 2023-11-03 NOTE — ED Triage Notes (Signed)
Pt c/o URI symptoms x3wks, OTC meds help. Denies NVD

## 2023-11-03 NOTE — ED Provider Notes (Signed)
[place] EMERGENCY DEPARTMENT AT [facility] Provider Note   CSN: [id number] Arrival date & time: 11/03/23  1736     History  Chief Complaint  Patient presents with   URI    Erika Ortiz is a 62 y.o. female.  Patient here with cough sick contacts here for the last few weeks.  Nothing makes it worse or better.  Denies any nausea vomiting diarrhea.  History of hypertension.  Denies any shortness of breath weakness numbness tingling.  Concern for bronchitis.  The history is provided by the patient.       Home Medications Prior to Admission medications   Medication Sig Start Date End Date Taking? Authorizing Provider  azithromycin (ZITHROMAX) 250 MG tablet Take 1 tablet (250 mg total) by mouth daily for 4 days. Take first 2 tablets together, then 1 every day until finished. 11/03/23 11/07/23 Yes Teresa Lemmerman, DO  Cholecalciferol (VITAMIN D PO) Take by mouth.    [provider]  fexofenadine-pseudoephedrine (ALLEGRA-D 24) 180-240 MG per 24 hr tablet Take 1 tablet by mouth daily.    [provider]  hydrochlorothiazide (HYDRODIURIL) 25 MG tablet Take 1 tablet (25 mg total) by mouth daily. 05/25/17   Rolan Bucco, MD  norethindrone (MICRONOR,CAMILA,ERRIN) 0.35 MG tablet Take 1 tablet (0.35 mg total) by mouth daily. 09/01/12   Haygood, Maris Berger, MD      Allergies    Amoxicillin, Hydrocodone, Naproxen, and Penicillins    Review of Systems   Review of Systems  Physical Exam Updated Vital Signs BP (!) 152/79   Pulse 61   Temp 97.8 F (36.6 C) (Oral)   Resp 16   LMP 12/11/2012   SpO2 100%  Physical Exam Vitals and nursing note reviewed.  Constitutional:      General: She is not in acute distress.    Appearance: She is well-developed. She is not ill-appearing.  HENT:     Head: Normocephalic and atraumatic.     Nose: Nose normal.     Mouth/Throat:     Mouth: Mucous membranes are moist.  Eyes:     Extraocular Movements: Extraocular  movements intact.     Conjunctiva/sclera: Conjunctivae normal.     Pupils: Pupils are equal, round, and reactive to light.  Cardiovascular:     Rate and Rhythm: Normal rate and regular rhythm.     Pulses: Normal pulses.     Heart sounds: Normal heart sounds. No murmur heard. Pulmonary:     Effort: Pulmonary effort is normal. No respiratory distress.     Breath sounds: Normal breath sounds.  Abdominal:     General: Abdomen is flat.     Palpations: Abdomen is soft.     Tenderness: There is no abdominal tenderness.  Musculoskeletal:        General: No swelling. Normal range of motion.     Cervical back: Normal range of motion and neck supple.  Skin:    General: Skin is warm and dry.     Capillary Refill: Capillary refill takes less than 2 seconds.  Neurological:     General: No focal deficit present.     Mental Status: She is alert.  Psychiatric:        Mood and Affect: Mood normal.     ED Results / Procedures / Treatments   Labs (all labs ordered are listed, but only abnormal results are displayed) Labs Reviewed  RESP PANEL BY RT-PCR (RSV, FLU A&B, COVID)  [id number]    EKG None  Radiology DG Chest 2 View  Result Date: 11/03/2023 CLINICAL DATA:  Cough and altered mental status. EXAM: CHEST - 2 VIEW COMPARISON:  None Available. FINDINGS: The heart size and mediastinal contours are within normal limits. Mildly increased suprahilar and infrahilar lung markings are noted, bilaterally. There is no evidence of an acute infiltrate, pleural effusion or pneumothorax. There is mild scoliosis of the thoracic spine. IMPRESSION: Findings which may represent mild bronchitis versus mild reactive airway disease. Electronically Signed   By: Aram Candela M.D.   On: 11/03/2023 22:03    Procedures Procedures    Medications Ordered in ED Medications  dexamethasone (DECADRON) tablet 10 mg (10 mg Oral Given 11/03/23 2257)  azithromycin (ZITHROMAX) tablet 500 mg (500 mg Oral Given 11/03/23  2258)    ED Course/ Medical Decision Making/ A&P                                 Medical Decision Making Amount and/or Complexity of Data Reviewed Radiology: ordered.  Risk Prescription drug management.   Leianna Joetta Manners is here with cough and congestion.  His 3 of hypertension, reflux.  Overall differential diagnosis viral process versus bronchitis versus pneumonia.  Chest x-ray showed bronchitis changes but no bunion.  COVID and flu test were negative.  I have no concern for ACS or PE or other acute process.  Sick contact with the same.  Overall patient given Decadron and will treat with Z-Pak.  Will have her follow-up with primary care doctor.  Discharged in good condition.  Understands return precautions.  This chart was dictated using voice recognition software.  Despite best efforts to proofread,  errors can occur which can change the documentation meaning.         Final Clinical Impression(s) / ED Diagnoses Final diagnoses:  Upper respiratory tract infection, unspecified type    Rx / DC Orders ED Discharge Orders          Ordered    azithromycin (ZITHROMAX) 250 MG tablet  Daily        11/03/23 2258              Virgina Norfolk, DO 11/03/23 2315

## 2024-06-18 ENCOUNTER — Encounter: Payer: Self-pay | Admitting: Advanced Practice Midwife

## 2024-07-27 ENCOUNTER — Other Ambulatory Visit: Payer: Self-pay | Admitting: Obstetrics and Gynecology

## 2024-07-27 ENCOUNTER — Other Ambulatory Visit (HOSPITAL_COMMUNITY)
Admission: RE | Admit: 2024-07-27 | Discharge: 2024-07-27 | Disposition: A | Discharge status: Home / Self Care | Payer: MEDICAID | Source: Ambulatory Visit | Attending: Obstetrics and Gynecology | Admitting: Obstetrics and Gynecology

## 2024-07-27 DIAGNOSIS — Z01419 Encounter for gynecological examination (general) (routine) without abnormal findings: Secondary | ICD-10-CM | POA: Insufficient documentation

## 2024-07-28 LAB — CYTOLOGY - PAP
Comment: NEGATIVE
Diagnosis: NEGATIVE
High risk HPV: NEGATIVE
